# Patient Record
Sex: Female | Born: 1965 | Race: Black or African American | Hispanic: No | Marital: Single | Smoking: Current every day smoker
Health system: Southern US, Community
[De-identification: ages and names within clinical notes are randomized; demographics above are authoritative.]

## PROBLEM LIST (undated history)

## (undated) DIAGNOSIS — J45909 Unspecified asthma, uncomplicated: Secondary | ICD-10-CM

## (undated) HISTORY — PX: ABDOMINAL HYSTERECTOMY: SHX81

---

## 2004-03-17 ENCOUNTER — Emergency Department: Payer: Self-pay | Admitting: Emergency Medicine

## 2006-08-18 ENCOUNTER — Emergency Department: Payer: Self-pay | Admitting: Internal Medicine

## 2006-10-13 ENCOUNTER — Emergency Department: Payer: Self-pay | Admitting: Unknown Physician Specialty

## 2006-10-26 ENCOUNTER — Ambulatory Visit: Payer: Self-pay | Admitting: Otolaryngology

## 2006-10-26 ENCOUNTER — Other Ambulatory Visit: Payer: Self-pay

## 2006-11-17 ENCOUNTER — Ambulatory Visit: Payer: Self-pay | Admitting: Otolaryngology

## 2011-09-13 ENCOUNTER — Ambulatory Visit: Payer: Self-pay | Admitting: Family Medicine

## 2012-06-01 ENCOUNTER — Ambulatory Visit: Payer: Self-pay | Admitting: Family Medicine

## 2013-03-28 ENCOUNTER — Emergency Department: Payer: Self-pay | Admitting: Internal Medicine

## 2013-11-13 ENCOUNTER — Emergency Department: Payer: Self-pay | Admitting: Emergency Medicine

## 2013-11-13 LAB — DRUG SCREEN, URINE

## 2013-11-13 LAB — URINALYSIS, COMPLETE
Bacteria: NONE SEEN
Bilirubin,UR: NEGATIVE
Blood: NEGATIVE
Glucose,UR: NEGATIVE mg/dL (ref 0–75)
Hyaline Cast: 2
Ketone: NEGATIVE
Leukocyte Esterase: NEGATIVE
NITRITE: NEGATIVE
PH: 5 (ref 4.5–8.0)
Protein: NEGATIVE
RBC,UR: 1 /HPF (ref 0–5)
Specific Gravity: 1.015 (ref 1.003–1.030)
Squamous Epithelial: 1

## 2013-11-13 LAB — COMPREHENSIVE METABOLIC PANEL
ALBUMIN: 3.3 g/dL — AB (ref 3.4–5.0)
ALK PHOS: 73 U/L
ALT: 23 U/L
AST: 34 U/L (ref 15–37)
Anion Gap: 11 (ref 7–16)
BUN: 8 mg/dL (ref 7–18)
Bilirubin,Total: 0.2 mg/dL (ref 0.2–1.0)
CALCIUM: 9 mg/dL (ref 8.5–10.1)
CHLORIDE: 105 mmol/L (ref 98–107)
Co2: 27 mmol/L (ref 21–32)
Creatinine: 0.76 mg/dL (ref 0.60–1.30)
Glucose: 95 mg/dL (ref 65–99)
Osmolality: 283 (ref 275–301)
Potassium: 3.9 mmol/L (ref 3.5–5.1)
Sodium: 143 mmol/L (ref 136–145)
TOTAL PROTEIN: 7.5 g/dL (ref 6.4–8.2)

## 2013-11-13 LAB — CBC
HCT: 40.3 % (ref 35.0–47.0)
HGB: 12.7 g/dL (ref 12.0–16.0)
MCH: 29.8 pg (ref 26.0–34.0)
MCHC: 31.5 g/dL — ABNORMAL LOW (ref 32.0–36.0)
MCV: 95 fL (ref 80–100)
Platelet: 170 10*3/uL (ref 150–440)
RBC: 4.26 10*6/uL (ref 3.80–5.20)
RDW: 13.6 % (ref 11.5–14.5)
WBC: 4.1 10*3/uL (ref 3.6–11.0)

## 2013-11-13 LAB — ACETAMINOPHEN LEVEL

## 2013-11-13 LAB — SALICYLATE LEVEL: Salicylates, Serum: 5.4 mg/dL — ABNORMAL HIGH

## 2013-11-13 LAB — ETHANOL
ETHANOL %: 0.106 % — AB (ref 0.000–0.080)
ETHANOL LVL: 106 mg/dL

## 2014-02-20 ENCOUNTER — Emergency Department: Payer: Self-pay | Admitting: Emergency Medicine

## 2014-02-20 LAB — CBC
HCT: 39.2 % (ref 35.0–47.0)
HGB: 12.6 g/dL (ref 12.0–16.0)
MCH: 28.6 pg (ref 26.0–34.0)
MCHC: 32.1 g/dL (ref 32.0–36.0)
MCV: 89 fL (ref 80–100)
Platelet: 196 10*3/uL (ref 150–440)
RBC: 4.39 10*6/uL (ref 3.80–5.20)
RDW: 12.5 % (ref 11.5–14.5)
WBC: 4.9 10*3/uL (ref 3.6–11.0)

## 2014-02-20 LAB — COMPREHENSIVE METABOLIC PANEL
ALK PHOS: 64 U/L
ALT: 17 U/L
AST: 11 U/L — AB (ref 15–37)
Albumin: 3.3 g/dL — ABNORMAL LOW (ref 3.4–5.0)
Anion Gap: 5 — ABNORMAL LOW (ref 7–16)
BILIRUBIN TOTAL: 0.1 mg/dL — AB (ref 0.2–1.0)
BUN: 13 mg/dL (ref 7–18)
CALCIUM: 9.1 mg/dL (ref 8.5–10.1)
CO2: 31 mmol/L (ref 21–32)
CREATININE: 0.79 mg/dL (ref 0.60–1.30)
Chloride: 104 mmol/L (ref 98–107)
EGFR (African American): >60
Glucose: 91 mg/dL (ref 65–99)
Osmolality: 279 (ref 275–301)
Potassium: 4.6 mmol/L (ref 3.5–5.1)
Sodium: 140 mmol/L (ref 136–145)
TOTAL PROTEIN: 7.2 g/dL (ref 6.4–8.2)

## 2014-02-20 LAB — URINALYSIS, COMPLETE
BLOOD: NEGATIVE
Bacteria: NONE SEEN
Bilirubin,UR: NEGATIVE
Glucose,UR: NEGATIVE mg/dL (ref 0–75)
Ketone: NEGATIVE
Leukocyte Esterase: NEGATIVE
NITRITE: NEGATIVE
Ph: 5 (ref 4.5–8.0)
Protein: NEGATIVE
RBC,UR: 2 /HPF (ref 0–5)
SPECIFIC GRAVITY: 1.02 (ref 1.003–1.030)
Squamous Epithelial: 1
WBC UR: 3 /HPF (ref 0–5)

## 2014-02-20 LAB — LIPASE, BLOOD: Lipase: 242 U/L (ref 73–393)

## 2014-02-27 ENCOUNTER — Inpatient Hospital Stay: Payer: Self-pay | Admitting: Internal Medicine

## 2014-02-27 LAB — CBC WITH DIFFERENTIAL/PLATELET
BASOS ABS: 0 10*3/uL (ref 0.0–0.1)
Basophil %: 0.3 %
EOS ABS: 0 10*3/uL (ref 0.0–0.7)
Eosinophil %: 0.5 %
HCT: 38.5 % (ref 35.0–47.0)
HGB: 12.3 g/dL (ref 12.0–16.0)
LYMPHS ABS: 1.4 10*3/uL (ref 1.0–3.6)
Lymphocyte %: 14.9 %
MCH: 28.6 pg (ref 26.0–34.0)
MCHC: 31.9 g/dL — AB (ref 32.0–36.0)
MCV: 90 fL (ref 80–100)
Monocyte #: 0.4 x10 3/mm (ref 0.2–0.9)
Monocyte %: 4.2 %
Neutrophil #: 7.4 10*3/uL — ABNORMAL HIGH (ref 1.4–6.5)
Neutrophil %: 80.1 %
Platelet: 192 10*3/uL (ref 150–440)
RBC: 4.3 10*6/uL (ref 3.80–5.20)
RDW: 13.1 % (ref 11.5–14.5)
WBC: 9.2 10*3/uL (ref 3.6–11.0)

## 2014-02-27 LAB — COMPREHENSIVE METABOLIC PANEL
ALK PHOS: 116 U/L
ANION GAP: 5 — AB (ref 7–16)
AST: 47 U/L — AB (ref 15–37)
Albumin: 3.3 g/dL — ABNORMAL LOW (ref 3.4–5.0)
BILIRUBIN TOTAL: 0.3 mg/dL (ref 0.2–1.0)
BUN: 11 mg/dL (ref 7–18)
CREATININE: 0.57 mg/dL — AB (ref 0.60–1.30)
Calcium, Total: 8.6 mg/dL (ref 8.5–10.1)
Chloride: 107 mmol/L (ref 98–107)
Co2: 27 mmol/L (ref 21–32)
EGFR (African American): >60
EGFR (Non-African Amer.): >60
GLUCOSE: 118 mg/dL — AB (ref 65–99)
OSMOLALITY: 278 (ref 275–301)
POTASSIUM: 5.4 mmol/L — AB (ref 3.5–5.1)
SGPT (ALT): 162 U/L — ABNORMAL HIGH
SODIUM: 139 mmol/L (ref 136–145)
Total Protein: 7.2 g/dL (ref 6.4–8.2)

## 2014-02-27 LAB — TSH: Thyroid Stimulating Horm: 1.72 u[IU]/mL

## 2014-02-27 LAB — TROPONIN I
Troponin-I: <0.02 ng/mL
Troponin-I: <0.02 ng/mL

## 2014-02-27 LAB — LIPASE, BLOOD: Lipase: 2981 U/L — ABNORMAL HIGH (ref 73–393)

## 2014-02-28 LAB — CBC WITH DIFFERENTIAL/PLATELET
BASOS PCT: 0.4 %
Basophil #: 0 10*3/uL (ref 0.0–0.1)
EOS ABS: 0 10*3/uL (ref 0.0–0.7)
Eosinophil %: 0 %
HCT: 36.6 % (ref 35.0–47.0)
HGB: 11.7 g/dL — AB (ref 12.0–16.0)
LYMPHS ABS: 0.6 10*3/uL — AB (ref 1.0–3.6)
LYMPHS PCT: 6.6 %
MCH: 28.7 pg (ref 26.0–34.0)
MCHC: 32 g/dL (ref 32.0–36.0)
MCV: 90 fL (ref 80–100)
Monocyte #: 0.3 x10 3/mm (ref 0.2–0.9)
Monocyte %: 3.8 %
NEUTROS ABS: 8 10*3/uL — AB (ref 1.4–6.5)
Neutrophil %: 89.2 %
Platelet: 188 10*3/uL (ref 150–440)
RBC: 4.09 10*6/uL (ref 3.80–5.20)
RDW: 13.2 % (ref 11.5–14.5)
WBC: 9 10*3/uL (ref 3.6–11.0)

## 2014-02-28 LAB — COMPREHENSIVE METABOLIC PANEL WITH GFR
Albumin: 3.2 g/dL — ABNORMAL LOW (ref 3.4–5.0)
Alkaline Phosphatase: 108 U/L
Anion Gap: 9 (ref 7–16)
BUN: 10 mg/dL (ref 7–18)
Bilirubin,Total: 0.3 mg/dL (ref 0.2–1.0)
Calcium, Total: 8.7 mg/dL (ref 8.5–10.1)
Chloride: 107 mmol/L (ref 98–107)
Co2: 25 mmol/L (ref 21–32)
Creatinine: 0.81 mg/dL (ref 0.60–1.30)
EGFR (African American): >60
EGFR (Non-African Amer.): >60
Glucose: 123 mg/dL — ABNORMAL HIGH (ref 65–99)
Osmolality: 282 (ref 275–301)
Potassium: 4.3 mmol/L (ref 3.5–5.1)
SGOT(AST): 27 U/L (ref 15–37)
SGPT (ALT): 134 U/L — ABNORMAL HIGH
Sodium: 141 mmol/L (ref 136–145)
Total Protein: 6.9 g/dL (ref 6.4–8.2)

## 2014-02-28 LAB — LIPASE, BLOOD: LIPASE: 5243 U/L — AB (ref 73–393)

## 2014-03-01 LAB — COMPREHENSIVE METABOLIC PANEL
ANION GAP: 7 (ref 7–16)
AST: 28 U/L (ref 15–37)
Albumin: 2.6 g/dL — ABNORMAL LOW (ref 3.4–5.0)
Alkaline Phosphatase: 93 U/L
BUN: 9 mg/dL (ref 7–18)
Bilirubin,Total: 0.4 mg/dL (ref 0.2–1.0)
CHLORIDE: 110 mmol/L — AB (ref 98–107)
CREATININE: 0.54 mg/dL — AB (ref 0.60–1.30)
Calcium, Total: 7.8 mg/dL — ABNORMAL LOW (ref 8.5–10.1)
Co2: 23 mmol/L (ref 21–32)
GLUCOSE: 88 mg/dL (ref 65–99)
Osmolality: 278 (ref 275–301)
POTASSIUM: 3.5 mmol/L (ref 3.5–5.1)
SGPT (ALT): 86 U/L — ABNORMAL HIGH
Sodium: 140 mmol/L (ref 136–145)
Total Protein: 5.8 g/dL — ABNORMAL LOW (ref 6.4–8.2)

## 2014-03-01 LAB — LIPASE, BLOOD: Lipase: 1347 U/L — ABNORMAL HIGH (ref 73–393)

## 2014-03-02 LAB — CBC WITH DIFFERENTIAL/PLATELET
BASOS ABS: 0 10*3/uL (ref 0.0–0.1)
Basophil %: 0.2 %
EOS ABS: 0.1 10*3/uL (ref 0.0–0.7)
Eosinophil %: 1.2 %
HCT: 31.9 % — ABNORMAL LOW (ref 35.0–47.0)
HGB: 10.4 g/dL — ABNORMAL LOW (ref 12.0–16.0)
LYMPHS ABS: 1.6 10*3/uL (ref 1.0–3.6)
Lymphocyte %: 14.2 %
MCH: 28.7 pg (ref 26.0–34.0)
MCHC: 32.7 g/dL (ref 32.0–36.0)
MCV: 88 fL (ref 80–100)
MONO ABS: 0.8 x10 3/mm (ref 0.2–0.9)
MONOS PCT: 6.8 %
NEUTROS ABS: 9 10*3/uL — AB (ref 1.4–6.5)
Neutrophil %: 77.6 %
PLATELETS: 145 10*3/uL — AB (ref 150–440)
RBC: 3.64 10*6/uL — AB (ref 3.80–5.20)
RDW: 12.6 % (ref 11.5–14.5)
WBC: 11.6 10*3/uL — AB (ref 3.6–11.0)

## 2014-03-02 LAB — COMPREHENSIVE METABOLIC PANEL
ALT: 65 U/L — AB
Albumin: 2.4 g/dL — ABNORMAL LOW (ref 3.4–5.0)
Alkaline Phosphatase: 91 U/L
Anion Gap: 9 (ref 7–16)
BUN: 8 mg/dL (ref 7–18)
Bilirubin,Total: 0.5 mg/dL (ref 0.2–1.0)
CO2: 24 mmol/L (ref 21–32)
CREATININE: 0.58 mg/dL — AB (ref 0.60–1.30)
Calcium, Total: 7.8 mg/dL — ABNORMAL LOW (ref 8.5–10.1)
Chloride: 108 mmol/L — ABNORMAL HIGH (ref 98–107)
EGFR (African American): >60
EGFR (Non-African Amer.): >60
Glucose: 58 mg/dL — ABNORMAL LOW (ref 65–99)
Osmolality: 277 (ref 275–301)
Potassium: 3.5 mmol/L (ref 3.5–5.1)
SGOT(AST): 19 U/L (ref 15–37)
Sodium: 141 mmol/L (ref 136–145)
Total Protein: 6 g/dL — ABNORMAL LOW (ref 6.4–8.2)

## 2014-03-02 LAB — LIPASE, BLOOD: Lipase: 306 U/L (ref 73–393)

## 2014-03-03 LAB — COMPREHENSIVE METABOLIC PANEL
ALBUMIN: 2.4 g/dL — AB (ref 3.4–5.0)
AST: 20 U/L (ref 15–37)
Alkaline Phosphatase: 82 U/L
Anion Gap: 5 — ABNORMAL LOW (ref 7–16)
BILIRUBIN TOTAL: 0.4 mg/dL (ref 0.2–1.0)
BUN: 3 mg/dL — ABNORMAL LOW (ref 7–18)
CALCIUM: 8 mg/dL — AB (ref 8.5–10.1)
CHLORIDE: 107 mmol/L (ref 98–107)
Co2: 29 mmol/L (ref 21–32)
Creatinine: 0.56 mg/dL — ABNORMAL LOW (ref 0.60–1.30)
EGFR (African American): >60
Glucose: 89 mg/dL (ref 65–99)
Osmolality: 277 (ref 275–301)
POTASSIUM: 3.2 mmol/L — AB (ref 3.5–5.1)
SGPT (ALT): 52 U/L
Sodium: 141 mmol/L (ref 136–145)
Total Protein: 6.1 g/dL — ABNORMAL LOW (ref 6.4–8.2)

## 2014-03-03 LAB — LIPASE, BLOOD: Lipase: 246 U/L (ref 73–393)

## 2014-07-13 NOTE — Consult Note (Signed)
Overall pain improved. Lipase coming down. Not hungry yet. Continue NPO rest of today. Perhaps, can start clears tomorrow if clinically improved. Make sure patient stays on low fat diet x 1 week even after discharge. Hopefully, discharge over the weekend. Pt to have EUS arranged later. Will check on Monday if patient still here. Thanks.  Electronic Signatures: Lutricia Feilh, Vanesa Renier (MD)  (Signed on 11-Dec-15 11:23)  Authored  Last Updated: 11-Dec-15 11:23 by Lutricia Feilh, Bunyan Brier (MD)

## 2014-07-13 NOTE — Consult Note (Signed)
See my ERCP note. Abnormal U/S and CT. Post ERCP with signif abdominal pain. So far, given fentanyl 150ug with temporary relief. Definite tenderness in abdomen. Pt likely developing acute pancreatitis with inadequate drainage of pancreatic juice from lack of pancreatic stenting. Asked hospitalist to admit patient. Keep patient NPO. Aggressive IV hydration. Pain and nausea meds. Labs, incl lipase tomorrow. Thanks.  Electronic Signatures: Lutricia Feilh, Fantasia Jinkins (MD)  (Signed on 09-Dec-15 16:39)  Authored  Last Updated: 09-Dec-15 16:39 by Lutricia Feilh, Janele Lague (MD)

## 2014-07-13 NOTE — Consult Note (Signed)
Chief Complaint:  Subjective/Chief Complaint Less abdominal pain than yesterday afternoon.   VITAL SIGNS/ANCILLARY NOTES: **Vital Signs.:   10-Dec-15 15:41  Vital Signs Type Q 8hr  Temperature Temperature (F) 99.1  Celsius 37.2  Temperature Source oral  Pulse Pulse 82  Respirations Respirations 17  Systolic BP Systolic BP 517  Diastolic BP (mmHg) Diastolic BP (mmHg) 91  Mean BP 111  Pulse Ox % Pulse Ox % 94  Pulse Ox Activity Level  At rest  Oxygen Delivery Room Air/ 21 %   Brief Assessment:  GEN no acute distress   Cardiac Regular   Respiratory clear BS   Gastrointestinal details normal Soft  diffuse tenderness with decreased bowel sounds   Lab Results: Hepatic:  10-Dec-15 04:03   Bilirubin, Total 0.3  Alkaline Phosphatase 108 (46-116 NOTE: New Reference Range 10/09/13)  SGPT (ALT)  134 (14-63 NOTE: New Reference Range 10/09/13)  SGOT (AST) 27  Total Protein, Serum 6.9  Albumin, Serum  3.2  Routine Chem:  10-Dec-15 04:03   Lipase  5243 (Result(s) reported on 28 Feb 2014 at 05:15AM.)  Glucose, Serum  123  BUN 10  Creatinine (comp) 0.81  Sodium, Serum 141  Potassium, Serum 4.3  Chloride, Serum 107  CO2, Serum 25  Calcium (Total), Serum 8.7  Osmolality (calc) 282  eGFR (African American) >60  eGFR (Non-African American) >60 (eGFR values <49m/min/1.73 m2 may be an indication of chronic kidney disease (CKD). Calculated eGFR, using the MRDR Study equation, is useful in  patients with stable renal function. The eGFR calculation will not be reliable in acutely ill patients when serum creatinine is changing rapidly. It is not useful in patients on dialysis. The eGFR calculation may not be applicable to patients at the low and high extremes of body sizes, pregnant women, and vegetarians.)  Anion Gap 9  Routine Hem:  10-Dec-15 04:03   WBC (CBC) 9.0  RBC (CBC) 4.09  Hemoglobin (CBC)  11.7  Hematocrit (CBC) 36.6  Platelet Count (CBC) 188  MCV 90  MCH  28.7  MCHC 32.0  RDW 13.2  Neutrophil % 89.2  Lymphocyte % 6.6  Monocyte % 3.8  Eosinophil % 0.0  Basophil % 0.4  Neutrophil #  8.0  Lymphocyte #  0.6  Monocyte # 0.3  Eosinophil # 0.0  Basophil # 0.0 (Result(s) reported on 28 Feb 2014 at 05:11AM.)   Assessment/Plan:  Assessment/Plan:  Assessment Post ERCP pancreatitis.   Plan Continue NPO, pain meds, nausea meds, IV hydration. Our office is in the process of arranging EUS with [facility]. Will see patient in AM. thanks.   Electronic Signatures: OVerdie Shire(MD)  (Signed 10-Dec-15 16:17)  Authored: Chief Complaint, VITAL SIGNS/ANCILLARY NOTES, Brief Assessment, Lab Results, Assessment/Plan   Last Updated: 10-Dec-15 16:17 by OVerdie Shire(MD)

## 2014-07-17 NOTE — H&P (Signed)
PATIENT NAME:  Jackie Rowe, Jackie Rowe MR#:  [id number] DATE OF BIRTH:  03-01-1966  DATE OF ADMISSION:  02/27/2014  PRIMARY CARE PHYSICIAN: None.   HISTORY OF PRESENT ILLNESS: The patient is 49 year old African American female with a history of abdominal pains and recent admission to the Emergency Room, which revealed abnormal CT scan, concerning for possible biliary tract abnormalities. She was brought in by Dr. Bluford Kaufmann to the endoscopy suite for an ERCP today, on 02/27/2014, because of ongoing abdominal pains. She attempted to get an ERCP done; however, it was unsuccessful, and because of her significant abdominal pains requiring 150 mcg of fentanyl injection, hospitalist services were contacted for admission. During my evaluation, the patient is very somnolent, poorly awakened and I am having difficulty getting any review of systems or in general getting any information.    Apparently, the patient presented to the Emergency Room on 02/20/2014. According to the Emergency Room note, the patient was having abdominal pain for the past 10 days prior to coming to the Emergency Room. She described the pain as suprapubic. She had a CT scan of her abdomen done on 02/20/2014, which revealed significant abnormalities concerning for biliary tract abnormalities, questionable mucosal enhancement, as well as focal areas of narrowing in the proximal common bile duct as well as progressive enhancing soft tissues in hepatic hilum for possible bile duct neoplasm such as cholangiocarcinoma or other biliary tract abnormalities such as extrahepatic cholangitis. ERCP was recommended. The patient was scheduled for followup with Dr. Lutricia Feil, and ERCP was performed today on 02/27/2014. Unfortunately, the procedure was unsuccessful, as mentioned above.   The patient herself now is complaining of significant abdominal pains in the upper abdomen. Apparently, the patient has been having those abdominal pains for a while. It is difficult to know  exactly how long she has been having those abdominal pains, but 20 years ago she had hysterectomy which was performed due to severe abdominal pains. She does not remember exactly what was the reason, why hysterectomy was in fact performed, but when I mention to her  endometriosis, she does not  remember this name being mentioned to her.  Past medical history is significant for what looks like and sounds like chronic abdominal pains.   MEDICATIONS: Norco 5/325 mg 1 tablet every 6 hours as needed, promethazine 12.5 mg 1-2 tablets every 6 hours as needed.   PAST SURGICAL HISTORY: Gallbladder surgery, hysterectomy 20 years ago, ERCP today on 02/27/2014 by Dr. Bluford Kaufmann.   ALLERGIES: None.   FAMILY HISTORY: The patient's sister had hypertension. The patient's father had lung cancer.   SOCIAL HISTORY: The patient is single and has no children. She has been smoking approximately 10 cigarettes a day since the age of 81. Does not drink any alcohol now, but in the past; stopped drinking in August 2015. She is not giving me any reason why she actually stopped drinking.   REVIEW OF SYSTEMS:  CONSTITUTIONAL: Positive for abdominal pains, intermittent lung wheezing, and nausea now, after numerous administrations of fentanyl. She denies any fevers, chills, fatigue, weakness, weight loss or gain.  EYES: Denies any blurry vision, double vision, glaucoma, or cataracts.  EARS, NOSE, AND THROAT: Denies any tinnitus, allergies, epistaxis, sinus pain, dentures, or difficulty swallowing.  RESPIRATORY: Denies cough, wheezes, asthma, or COPD.  CARDIOVASCULAR: Denies any chest pain, orthopnea, or arrhythmias.  GASTROINTESTINAL: Denies any diarrhea, rectal bleeding, change in bowel habits.  GENITOURINARY: Denies dysuria, hematuria, frequency, incontinence.  ENDOCRINOLOGY: Denies any polydipsia, nocturia, thyroid problems, heat  or cold intolerance or thirst.  HEMATOLOGIC: Denies anemia, easy bruising or bleeding, or swollen  glands. SKIN: Denies any acne, rashes, lesions or change in moles.  MUSCULOSKELETAL: Denies arthritis, cramps, swelling.  NEUROLOGIC: Denies numbness, epilepsy or tremor.  PSYCHIATRIC: Denies anxiety, insomnia, or depression.   PHYSICAL EXAMINATION:  VITAL SIGNS: During my evaluation, temperature was unknown, pulse was 47, respirations were 16, blood pressure 140 to 150s over 90s, oxygen saturation was 97%.  GENERAL: This is a well-developed, well-nourished African American female lying on the stretcher, very poorly awakened, very somnolent and groggy.  HEENT: Her pupils are equal and reactive to light. Extraocular muscles are intact.  No icterus. No jaundice. Has normal hearing. No pharyngeal erythema. Mucosa is moist.  NECK: No masses, Supple, nontender. Thyroid is not enlarged. No adenopathy. No JVD or carotid bruits bilaterally.  LUNGS: Clear to auscultation in all fields. Somewhat diminished breath sounds, but no rales, rhonchi, or wheezing. No labored inspirations, increased effort, dullness to percussion or overt respiratory distress.  CARDIOVASCULAR: S1 and S2 appreciated.  Regular rate and rhythm. PMI not lateralized. Chest is nontender to palpation. There are 1+ pedal pulses. No lower extremity edema, calf tenderness, or cyanosis.  ABDOMEN: Protuberant, soft. No hepatosplenomegaly or masses were noted. Bowel sounds were present, but diminished. The patient does have significant tenderness all over her abdomen, mostly in the upper abdomen; however, no rebound or guarding was noted.  MUSCULOSKELETAL: Muscle Strength: Able to move all extremities. No cyanosis, degenerative joint disease or kyphosis. Gait not tested.  SKIN: Did not reveal any rashes, lesions, erythema, nodularity, or induration. It was warm and dry to palpation.  LYMPHATIC: No adenopathy in the cervical region.  NEUROLOGIC: Cranial nerves grossly intact. Sensory is intact. No dysarthria or aphasia. The patient is somnolent,  poorly awakens. Cranial nerves grossly intact. Sensory is intact. The patient does have some dysarthria. She is somnolent, poorly cooperative. Memory is impaired.   LABORATORY DATA: Labs are pending. The patient's labs from 02/25/2014, revealed a normal BMP, normal lipase level of 242, albumin level of 3.3, total bilirubin was 0.1; otherwise, unremarkable liver enzymes. CBC: Within normal limits with white blood cell count 4.9, hemoglobin 12.6, platelets 196,000. Urinalysis at that point was normal, on 02/25/2014, with 3 red blood cells as well as 2 white blood cells. EKG at that time revealed sinus brady at 49 beats per minute, nonspecific T-wave abnormality, but no significant change since prior EKG in August. The patient's current labs are pending. No EKG was performed as of yet.   ASSESSMENT AND PLAN:  1.  Diffuse abdominal pain of unclear etiology. Very concerning for possible chronic abdominal pains as the patient is describing abdominal pains for the past 7 years, including requiring hysterectomy for abdominal pains. Admit the patient to the medical floor, supportive therapy. Will not give her any opiates at this time. The patient is bradycardic to 48 during my evaluation. We will get laboratories including comprehensive metabolic panel, lipase level, and we will start the patient on PPIs. We will get gastroenterology involved for further recommendations.  2.  Bradycardia, likely opiate related. Get EKG.  3.  Nausea. Again, possibly opiate related; however, could be procedure. To initiate the patient on PPI, Zofran, as well as Phenergan and IV fluids.  4.  Abnormal radiologic studies concerning for biliary mass. We will get comprehensive metabolic panel, lipase level, and gastroenterology consultation.   TIME SPENT: 50 minutes.    ____________________________ Katharina Caperima Madelyn Tlatelpa, MD rv:MT D: 02/27/2014 17:45:57  ET T: 02/27/2014 19:03:04 ET JOB#: [id number]  cc: Katharina Caper, MD, <Dictator> Seiya Silsby MD ELECTRONICALLY SIGNED 04/02/2014 18:39

## 2014-07-17 NOTE — Discharge Summary (Signed)
PATIENT NAME:  Jackie Rowe, Jackie Rowe MR#:  [id number] DATE OF BIRTH:  1965/09/02  DATE OF ADMISSION:  02/27/2014 DATE OF DISCHARGE:  03/03/2014  ADMITTING PHYSICIAN: Katharina Caperima Vaickute, MD  DISCHARGING PHYSICIAN: Enid Baasadhika Keyontae Huckeby, MD   PRIMARY CARE PHYSICIAN: None.   CONSULTATIONS IN THE HOSPITAL: GI consultation by Dr. Bluford Kaufmannh.   DISCHARGE DIAGNOSES: 1. Acute pancreatitis post endoscopic retrograde cholangiopancreatography.  2. Abnormalities of biliary tract noted on CT scan, outpatient endoscopic ultrasonography.  3. Hyperkalemia.  4. Tobacco use disorder.   DISCHARGE HOME MEDICATIONS:  1. Norco 5/325 mg 1 tablet every 6 hours p.r.n. for pain.  2. Promethazine 25 mg every 8 hours p.r.n. for nausea and vomiting.   DISCHARGE DIET: Low-sodium diet.   DISCHARGE ACTIVITY: As tolerated.    FOLLOWUP INSTRUCTIONS:  1. PCP follow-up in 1 week.  2. GI follow-up for EUS in 2 weeks.   LABORATORY STUDIES: Prior to discharge: Sodium 141, potassium 3.2, chloride 107, bicarbonate 29, BUN 3, creatinine 0.56, glucose 89 and calcium of 8.0. ALT 52, AST 20, alkaline phosphatase 82, total bilirubin 0.4, albumin of 2.4, lipase is 246. WBC 11.6, hemoglobin 10.4, hematocrit 31.9, platelet count 145,000. Lipase on admission was 2981.   BRIEF HOSPITAL COURSE: Ms Alessandra BevelsVaughn is a 49 year old, African-American female with no significant past medical history, presented to the emergency room for abdominal pain. Had CT and ultrasound done on 02/20/2014, which revealed significant abnormalities of the biliary tract, mucosal enhancement and narrowing of the common bile. They commented even possible neoplasm cannot be excluded as well. ERCP was recommended. The patient was taken for ERCP by Dr. Bluford Kaufmannh as an outpatient on 02/27/2014. Unfortunately, during the procedure, the patient developed significant abdominal pain and the procedure could not be completed. He was noted to have acute pancreatitis.  1. Acute pancreatitis post ERCP. The ERCP  was not completed. Seen by GI. The patient was kept n.p.o. Fluids were given. Waited until the pancreas got rest and lipase normalized. Now she is able to eat a regular diet. Lipase is normal. Does not have any further abdominal pain, nausea or vomiting complaints. She still has biliary tract abnormalities noted on the CT scan, for which Dr. Bluford Kaufmannh recommended outpatient follow-up after complete healing of her pancreatitis for an EUS to be done as an outpatient. The patient was given some pain medication and nausea medication as needed.  2. Transient bradycardia in the hospital, likely opiate related, resolved.  3. Transient hypertension, likely from pain and anxiety. The patient denied any medications at this time and said she would follow up as an outpatient.  4. Hypokalemia, replaced appropriately.  5. Her course has been otherwise uneventful in the hospital.   DISCHARGE CONDITION: Stable.   DISCHARGE DISPOSITION: Home.  TIME SPENT ON DISCHARGE: 40 minutes.     ____________________________ Enid Baasadhika Geroldine Esquivias, MD rk:TT D: 03/03/2014 12:34:41 ET T: 03/03/2014 17:28:50 ET JOB#: [id number]  cc: Enid Baasadhika Nataliya Graig, MD, <Dictator> Enid BaasADHIKA Ayrabella Labombard MD ELECTRONICALLY SIGNED 03/26/2014 14:36

## 2015-01-29 ENCOUNTER — Other Ambulatory Visit: Payer: Self-pay | Admitting: Family Medicine

## 2015-01-29 ENCOUNTER — Ambulatory Visit
Admission: RE | Admit: 2015-01-29 | Discharge: 2015-01-29 | Disposition: A | Discharge status: Home / Self Care | Payer: Self-pay | Source: Ambulatory Visit | Attending: Family Medicine | Admitting: Family Medicine

## 2015-01-29 DIAGNOSIS — R7611 Nonspecific reaction to tuberculin skin test without active tuberculosis: Secondary | ICD-10-CM

## 2015-11-24 IMAGING — CT CT ABDOMEN WO/W CM
2 of 10 series · 11 of 46 positions shown, 16 images · IV contrast (isovue)
Comparison: Abdominal ultrasound 02/20/2014.

CLINICAL DATA: 48-year-old female with history of 10 days of right
upper quadrant abdominal pain. Possible pancreatic mass noted on
recent abdominal ultrasound. Follow-up examination to evaluate for
potential malignancy.

EXAM:
CT ABDOMEN WITHOUT AND WITH CONTRAST
TECHNIQUE: Multidetector CT imaging of the abdomen was performed following the
standard protocol before and following the bolus administration of
intravenous contrast.
CONTRAST:  100 mL of Isovue 370.

[Series 8: pancreas venous thins · axial · portal-venous · 0.56mm/px · z∈[-707,-532]mm · 8 of 151 slices shown, 13 images]
[im 17/151  soft-tissue]
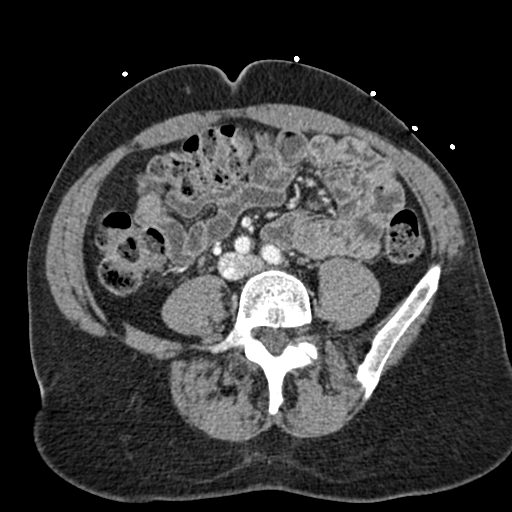
[im 17/151  bone]
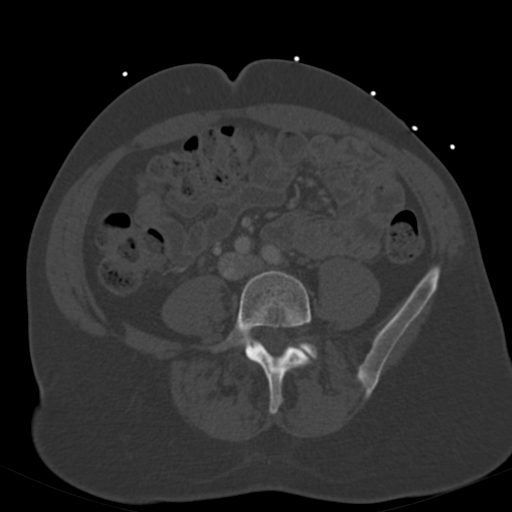
[im 34/151  soft-tissue]
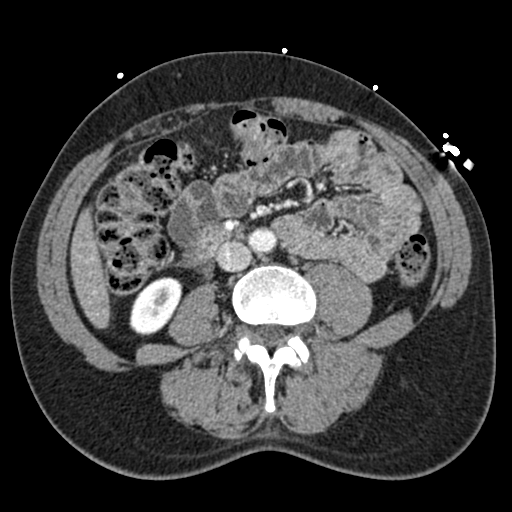
[im 51/151  soft-tissue]
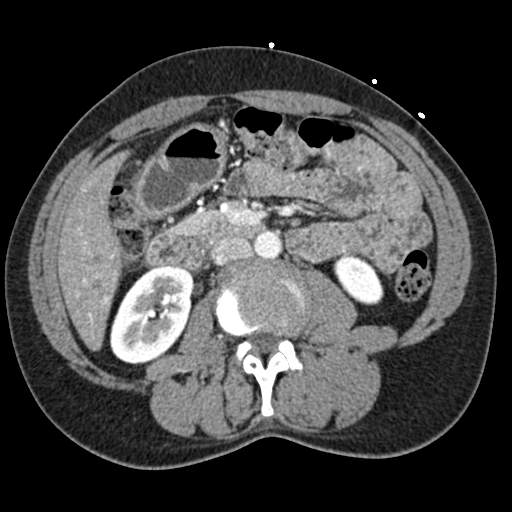
[im 67/151  soft-tissue]
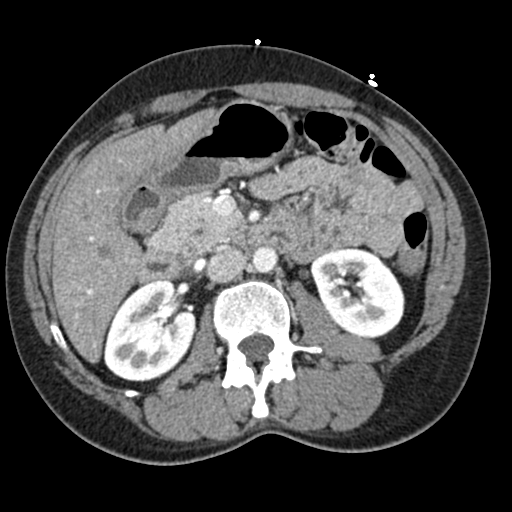
[im 84/151  soft-tissue]
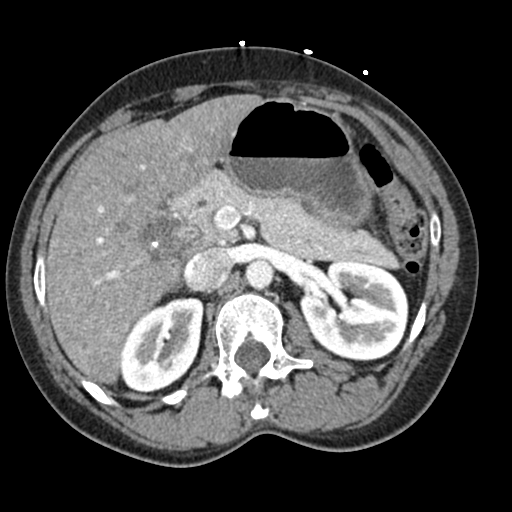
[im 84/151  lung]
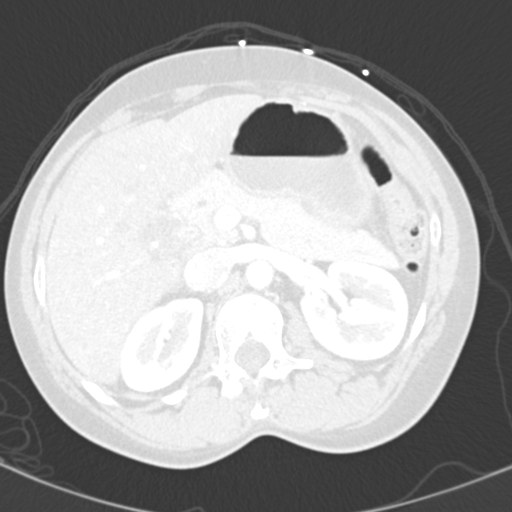
[im 101/151  soft-tissue]
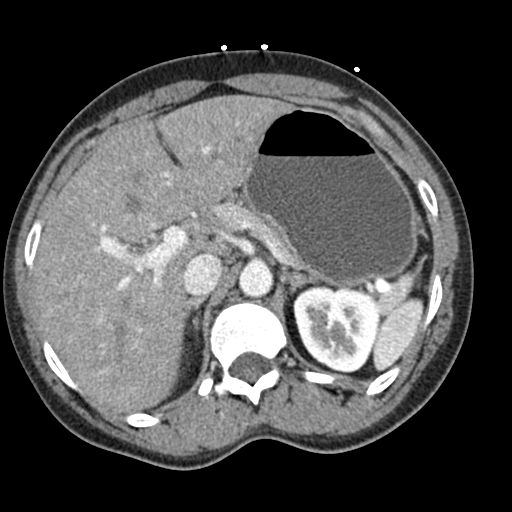
[im 101/151  lung]
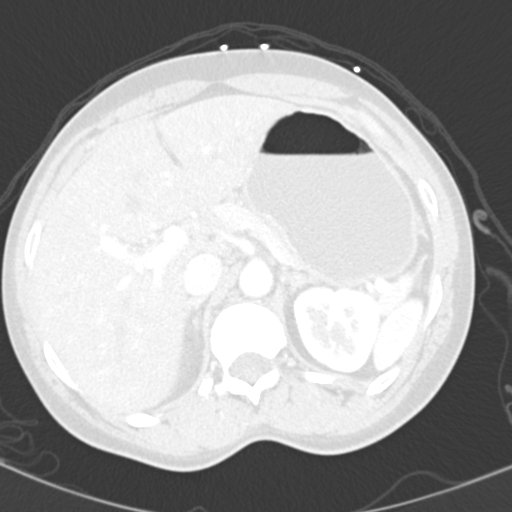
[im 117/151  soft-tissue]
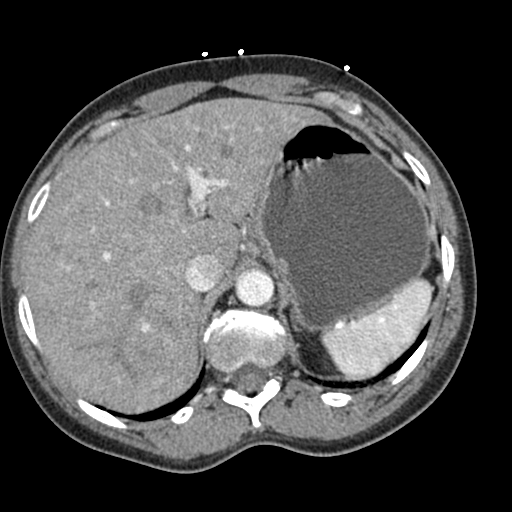
[im 117/151  lung]
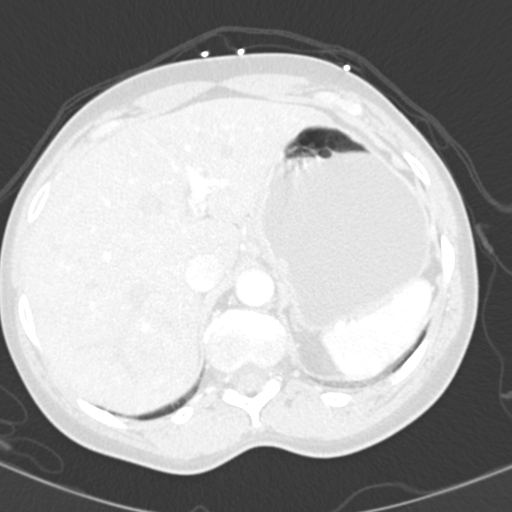
[im 134/151  soft-tissue]
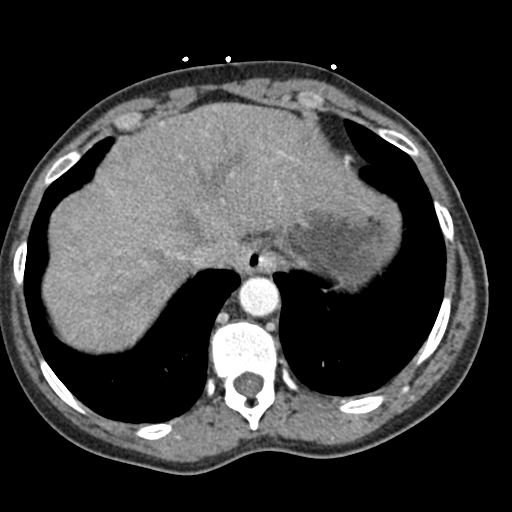
[im 134/151  lung]
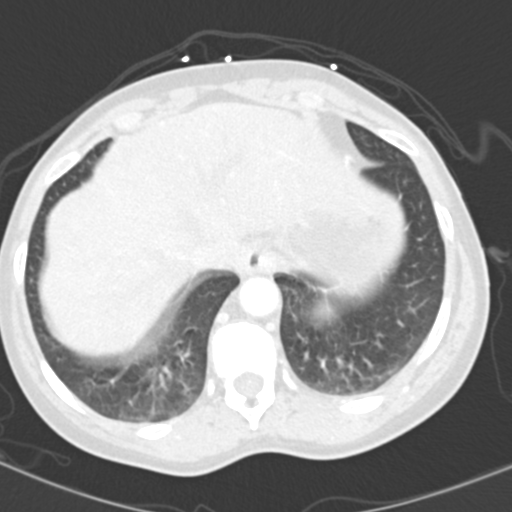

[Series 9: cor pancreas arterial · coronal · arterial · 0.45mm/px · 3 of 119 slices shown]
[im 30/119  soft-tissue]
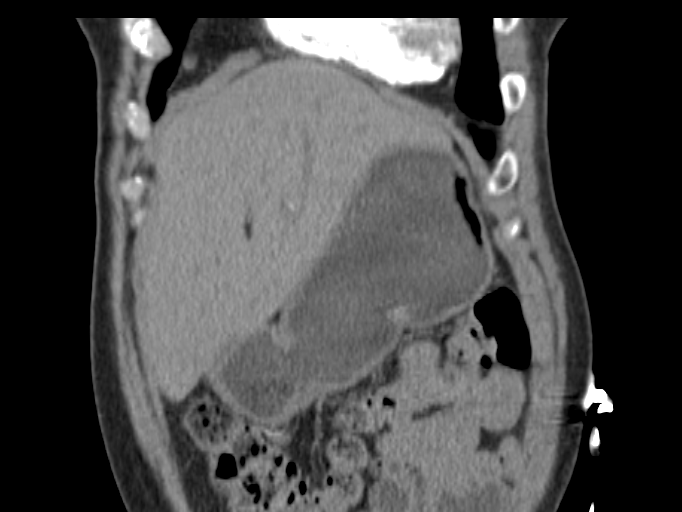
[im 60/119  soft-tissue]
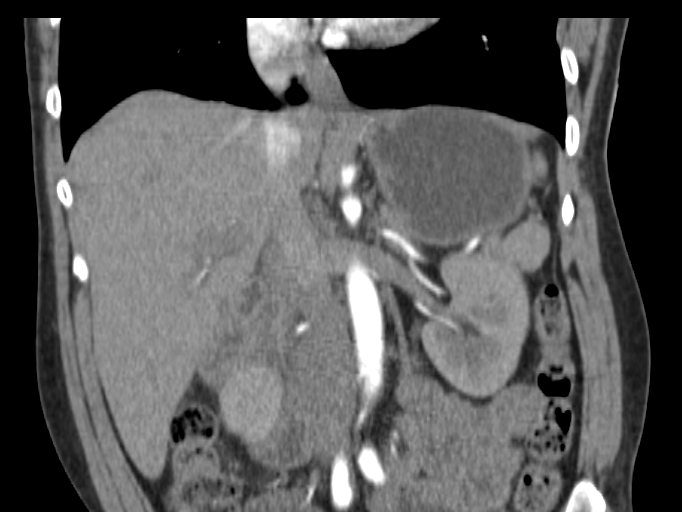
[im 89/119  soft-tissue]
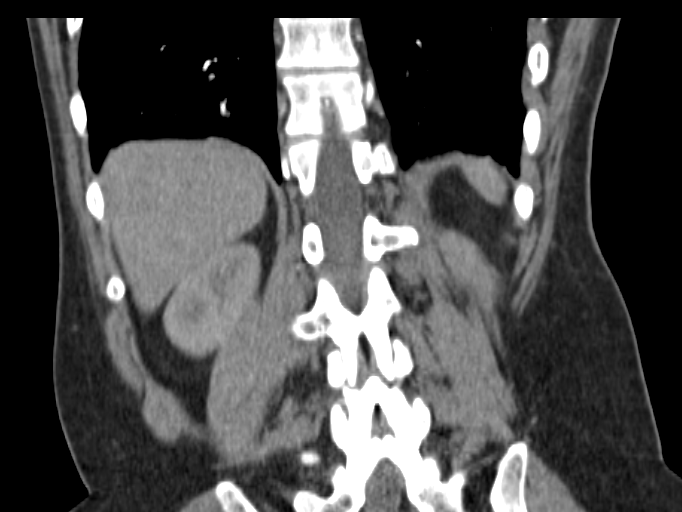

[11 of 46 positions shown; findings below may reference images not displayed]

FINDINGS: Lower chest: 6 mm ground-glass attenuation nodule in the left lower
lobe (image 11 of series 4). Extensive bronchial wall thickening
throughout the visualized lung bases bilaterally.

Hepatobiliary: Status post cholecystectomy. No suspicious cystic or
solid hepatic lesions. No intrahepatic biliary ductal dilatation.
However, the common hepatic duct, cystic duct remnant and common
bile duct all demonstrate very profound mucosal enhancement.
Additionally, there is some amorphous soft tissue adjacent to the
common hepatic duct and cystic duct remnant, best appreciated on
image 46 of series 7 in the hepatic hilum, which appears to
progressively enhance. Focal narrowing of the proximal common bile
duct is also noted on images 51-54 of series 7.

Pancreas: The pancreas is grossly normal in appearance.
Specifically, no pancreatic masses identified. No pancreatic ductal
dilatation (pancreatic duct measures up to 3 mm). No peripancreatic
inflammatory changes or lymphadenopathy.

Spleen: Unremarkable.

Adrenals/Urinary Tract: Bilateral adrenal glands and bilateral
kidneys are normal in appearance. No hydroureteronephrosis.

Stomach/Bowel: The stomach is normal in appearance. No pathologic
dilatation of small bowel or colon in the visualized abdomen.

Vascular/Lymphatic: Mild atherosclerosis in the abdominal and upper
pelvic vasculature, without evidence of aneurysm or dissection. No
lymphadenopathy noted in the abdomen.

Other: Within the visualized peritoneal cavity there is no
significant volume of ascites and no pneumoperitoneum.

Musculoskeletal: There are no aggressive appearing lytic or blastic
lesions noted in the visualized portions of the skeleton.
IMPRESSION: 1. No pancreatic head mass or peripancreatic lesion identified to
account for the perceived abnormality on the recent ultrasound
examination.
2. However, there is a very unusual appearance of the common hepatic
duct, cystic duct remnant and common bile duct, which demonstrate
avid mucosal enhancement, focal areas of narrowing in the proximal
common bile duct, and some associated surrounding progressively
enhancing soft tissue in the hepatic hilum. These findings are
concerning for potential bile duct neoplasm such as
cholangiocarcinoma, or potentially biliary tract infection (i.e.,
extrahepatic cholangitis). However, there is no associated
intrahepatic biliary ductal dilatation at this time. Clinical
correlation is recommended, with further evaluation with ERCP and/or
MRCP suggested in the near future.
3. 6 mm ground-glass attenuation nodule in the left lower lobe.
Initial follow-up by chest CT without contrast is recommended in 3
months to confirm persistence. This recommendation follows the
consensus statement: Recommendations for the Management of Subsolid
Pulmonary Nodules Detected at CT: A Statement from the Closet

## 2017-11-14 ENCOUNTER — Other Ambulatory Visit: Payer: Self-pay | Admitting: Family Medicine

## 2017-11-14 DIAGNOSIS — Z1231 Encounter for screening mammogram for malignant neoplasm of breast: Secondary | ICD-10-CM

## 2017-12-13 ENCOUNTER — Ambulatory Visit
Admission: RE | Admit: 2017-12-13 | Discharge: 2017-12-13 | Disposition: A | Discharge status: Home / Self Care | Payer: PRIVATE HEALTH INSURANCE | Source: Ambulatory Visit | Attending: Family Medicine | Admitting: Family Medicine

## 2017-12-13 DIAGNOSIS — Z1231 Encounter for screening mammogram for malignant neoplasm of breast: Secondary | ICD-10-CM | POA: Diagnosis present

## 2018-11-30 ENCOUNTER — Other Ambulatory Visit: Payer: Self-pay | Admitting: Allergy

## 2018-12-12 ENCOUNTER — Other Ambulatory Visit: Payer: Self-pay | Admitting: Family Medicine

## 2018-12-12 DIAGNOSIS — Z1231 Encounter for screening mammogram for malignant neoplasm of breast: Secondary | ICD-10-CM

## 2019-01-24 ENCOUNTER — Ambulatory Visit
Admission: RE | Admit: 2019-01-24 | Discharge: 2019-01-24 | Disposition: A | Discharge status: Home / Self Care | Payer: BLUE CROSS/BLUE SHIELD | Source: Ambulatory Visit | Attending: Family Medicine | Admitting: Family Medicine

## 2019-01-24 DIAGNOSIS — Z1231 Encounter for screening mammogram for malignant neoplasm of breast: Secondary | ICD-10-CM | POA: Insufficient documentation

## 2019-06-22 ENCOUNTER — Ambulatory Visit: Payer: PRIVATE HEALTH INSURANCE | Attending: Internal Medicine

## 2019-06-22 DIAGNOSIS — Z23 Encounter for immunization: Secondary | ICD-10-CM

## 2019-06-22 NOTE — Progress Notes (Signed)
   Covid-19 Vaccination Clinic  Name:  JAKAILA NORMENT    MRN: [id number] DOB: 12-03-1965  06/22/2019  Ms. Kuehne was observed post Covid-19 immunization for 15 minutes without incident. She was provided with Vaccine Information Sheet and instruction to access the V-Safe system.   Ms. Concepcion was instructed to call 911 with any severe reactions post vaccine: Marland Kitchen Difficulty breathing  . Swelling of face and throat  . A fast heartbeat  . A bad rash all over body  . Dizziness and weakness   Immunizations Administered    Name Date Dose VIS Date Route   Pfizer COVID-19 Vaccine 06/22/2019  8:33 AM 0.3 mL 03/02/2019 Intramuscular   Manufacturer: [employer], [employer]   Lot: [id number]   NDC: [address]-1

## 2019-06-25 ENCOUNTER — Ambulatory Visit: Payer: PRIVATE HEALTH INSURANCE

## 2019-07-17 ENCOUNTER — Ambulatory Visit: Payer: PRIVATE HEALTH INSURANCE | Attending: Internal Medicine

## 2019-07-17 DIAGNOSIS — Z23 Encounter for immunization: Secondary | ICD-10-CM

## 2019-07-17 NOTE — Progress Notes (Signed)
   Covid-19 Vaccination Clinic  Name:  Jackie Rowe    MRN: [id number] DOB: [birth year]  07/17/2019  [name] was observed post Covid-19 immunization for 15 minutes without incident. She was provided with Vaccine Information Sheet and instruction to access the V-Safe system.   [name] was instructed to call 911 with any severe reactions post vaccine: Marland Kitchen Difficulty breathing  . Swelling of face and throat  . A fast heartbeat  . A bad rash all over body  . Dizziness and weakness   Immunizations Administered    Name Date Dose VIS Date Route   Pfizer COVID-19 Vaccine 07/17/2019  4:05 PM 0.3 mL 05/16/2018 Intramuscular   Manufacturer: [employer], [employer]   Lot: [id number]   NDC: [address]-1

## 2019-07-28 ENCOUNTER — Encounter: Payer: Self-pay | Admitting: Emergency Medicine

## 2019-07-28 ENCOUNTER — Emergency Department
Admission: EM | Admit: 2019-07-28 | Discharge: 2019-07-28 | Disposition: A | Discharge status: Home / Self Care | Payer: BLUE CROSS/BLUE SHIELD | Attending: Emergency Medicine | Admitting: Emergency Medicine

## 2019-07-28 ENCOUNTER — Other Ambulatory Visit: Payer: Self-pay

## 2019-07-28 DIAGNOSIS — F17210 Nicotine dependence, cigarettes, uncomplicated: Secondary | ICD-10-CM | POA: Insufficient documentation

## 2019-07-28 DIAGNOSIS — J45909 Unspecified asthma, uncomplicated: Secondary | ICD-10-CM | POA: Insufficient documentation

## 2019-07-28 DIAGNOSIS — L723 Sebaceous cyst: Secondary | ICD-10-CM | POA: Diagnosis not present

## 2019-07-28 DIAGNOSIS — L089 Local infection of the skin and subcutaneous tissue, unspecified: Secondary | ICD-10-CM | POA: Insufficient documentation

## 2019-07-28 DIAGNOSIS — L0211 Cutaneous abscess of neck: Secondary | ICD-10-CM | POA: Diagnosis present

## 2019-07-28 HISTORY — DX: Unspecified asthma, uncomplicated: J45.909

## 2019-07-28 MED ORDER — HYDROCODONE-ACETAMINOPHEN 5-325 MG PO TABS: 1.0000 | ORAL_TABLET | Freq: Four times a day (QID) | ORAL | 0 refills | Status: AC | PRN

## 2019-07-28 MED ORDER — SULFAMETHOXAZOLE-TRIMETHOPRIM 800-160 MG PO TABS: 1.0000 | ORAL_TABLET | Freq: Two times a day (BID) | ORAL | 0 refills | Status: AC

## 2019-07-28 MED ORDER — LIDOCAINE HCL (PF) 1 % IJ SOLN
5.0000 mL | Freq: Once | INTRAMUSCULAR | Status: AC
  Administered 2019-07-28: 14:00:00 5 mL
  Filled 2019-07-28: qty 5

## 2019-07-28 NOTE — ED Triage Notes (Signed)
Pt presents to ED via POV with c/o cyst to her neck. Pt states has been there for more than 1 year. Pt denies difficulty breathing at this time, pt states has come a head. Pt denies any changes in voice, difficulty swallowing, difficulty breathing.

## 2019-07-28 NOTE — ED Notes (Signed)
Pt c/o abscess on neck area that has been growing over time for about a year. Pt states it has grown larger over the past few days and has never been lanced before.

## 2019-07-28 NOTE — ED Provider Notes (Signed)
[facility] Emergency Department Provider Note   ____________________________________________   First MD Initiated Contact with Patient 07/28/19 1242     (approximate)  I have reviewed the triage vital signs and the nursing notes.   HISTORY  Chief Complaint Abscess   HPI Jackie Rowe is a 54 y.o. female presents to the ED with complaint of an abscess on her neck.  Patient states that there is been an area there for approximately 1 year.  She states is gotten a lot larger in the last few days and now erythematous.  She denies any fever, chills, nausea or vomiting.  Currently she denies any pain.       Past Medical History:  Diagnosis Date  . Asthma     There are no problems to display for this patient.   Past Surgical History:  Procedure Laterality Date  . ABDOMINAL HYSTERECTOMY      Prior to Admission medications   Medication Sig Start Date End Date Taking? Authorizing Provider  HYDROcodone-acetaminophen (NORCO/VICODIN) 5-325 MG tablet Take 1 tablet by mouth every 6 (six) hours as needed for moderate pain. 07/28/19   Tommi Rumps, PA-C  sulfamethoxazole-trimethoprim (BACTRIM DS) 800-160 MG tablet Take 1 tablet by mouth 2 (two) times daily. 07/28/19   Tommi Rumps, PA-C    Allergies Patient has no known allergies.  History reviewed. No pertinent family history.  Social History Social History   Tobacco Use  . Smoking status: Current Every Day Smoker    Packs/day: 0.50    Types: Cigarettes  . Smokeless tobacco: Never Used  Substance Use Topics  . Alcohol use: Not Currently    Comment: last use 5 yrs ago  . Drug use: Not Currently    Comment: last use 5 yrs ago    Review of Systems Constitutional: No fever/chills Eyes: No visual changes. ENT: No sore throat. Cardiovascular: Denies chest pain. Respiratory: Denies shortness of breath. Gastrointestinal:   No nausea, no vomiting.  Musculoskeletal: Negative for muscle  aches. Skin: Positive for abscess. Neurological: Negative for headaches, focal weakness or numbness. ____________________________________________   PHYSICAL EXAM:  VITAL SIGNS: ED Triage Vitals  Enc Vitals Group     BP 07/28/19 1232 137/88     Pulse Rate 07/28/19 1232 (!) 50     Resp 07/28/19 1232 20     Temp 07/28/19 1232 98.2 F (36.8 C)     Temp Source 07/28/19 1232 Oral     SpO2 07/28/19 1232 99 %     Weight 07/28/19 1233 150 lb (68 kg)     Height 07/28/19 1233 5\' 4"  (1.626 m)     Head Circumference --      Peak Flow --      Pain Score 07/28/19 1233 0     Pain Loc --      Pain Edu? --      Excl. in [place]? --    Constitutional: Alert and oriented. Well appearing and in no acute distress. Eyes: Conjunctivae are normal.  Head: Atraumatic. Neck: No stridor.   Cardiovascular: Normal rate, regular rhythm. Grossly normal heart sounds.  Good peripheral circulation. Respiratory: Normal respiratory effort.  No retractions. Lungs CTAB. Gastrointestinal: Soft and nontender. No distention. Musculoskeletal: Moves upper and lower extremities with any difficulty.  Normal gait was noted. Neurologic:  Normal speech and language. No gross focal neurologic deficits are appreciated.  Skin:  Skin is warm, dry.  Erythematous 2 cm sebaceous cyst anterior mid neck is present.  Area is moderately tender to touch.  There is a pustule present at the top of the infected sebaceous cyst. Psychiatric: Mood and affect are normal. Speech and behavior are normal.  ____________________________________________   LABS (all labs ordered are listed, but only abnormal results are displayed)  Labs Reviewed - No data to display  PROCEDURES  Procedure(s) performed (including Critical Care):  Marland KitchenMarland KitchenIncision and Drainage  Date/Time: 07/28/2019 1:47 PM Performed by: Johnn Hai, PA-C Authorized by: Johnn Hai, PA-C   Consent:    Consent obtained:  Verbal   Consent given by:  Patient   Risks  discussed:  Pain and infection Location:    Type:  Cyst   Location:  Neck   Neck location:  L anterior Pre-procedure details:    Skin preparation:  Antiseptic wash Anesthesia (see MAR for exact dosages):    Anesthesia method:  Local infiltration   Local anesthetic:  Lidocaine 1% w/o epi Procedure type:    Complexity:  Simple Procedure details:    Needle aspiration: no     Incision types:  Single straight   Incision depth:  Dermal   Scalpel blade:  11   Wound management:  Probed and deloculated   Drainage:  Purulent   Drainage amount:  Scant   Wound treatment:  Drain placed   Packing materials:  1/4 in iodoform gauze Post-procedure details:    Patient tolerance of procedure:  Tolerated well, no immediate complications   ____________________________________________   INITIAL IMPRESSION / ASSESSMENT AND PLAN / ED COURSE  As part of my medical decision making, I reviewed the following data within the electronic MEDICAL RECORD NUMBER Notes from prior ED visits and [place] Controlled Substance Database  54 year old female presents to the ED with complaint of abscess on her anterior mid neck.  Patient states that she has had a cyst in the same area for approximately 1 year.  She had her PCP look at it and was told to watch it.  It is only been the last several days but it became extremely red and tender to touch.  Area was I&D and without any problems.  Patient is aware that she needs to start on the antibiotic today and Bactrim and Norco was sent to her pharmacy.  She is to return to the emergency department in 2 days or to her PCP for removal of the iodoform drain.  ____________________________________________   FINAL CLINICAL IMPRESSION(S) / ED DIAGNOSES  Final diagnoses:  Infected sebaceous cyst of skin     ED Discharge Orders         Ordered    sulfamethoxazole-trimethoprim (BACTRIM DS) 800-160 MG tablet  2 times daily     07/28/19 1343    HYDROcodone-acetaminophen  (NORCO/VICODIN) 5-325 MG tablet  Every 6 hours PRN     07/28/19 1343           Note:  This document was prepared using Dragon voice recognition software and may include unintentional dictation errors.    Johnn Hai, PA-C 07/28/19 1406    Delman Kitten, MD 07/28/19 [phone]

## 2019-07-28 NOTE — Discharge Instructions (Signed)
Follow-up with your primary care provider or return to the emergency department in 2 days to have the drain removed if it has not already fallen out on it's own.  Begin taking antibiotics as directed twice a day for the next 10 days and Norco is a narcotic pain medication.  This medication should not be taken while you are driving or operating machinery.  You may also use warm compresses to the area to help with drainage.

## 2020-02-04 ENCOUNTER — Other Ambulatory Visit: Payer: Self-pay | Admitting: Family Medicine

## 2020-02-04 DIAGNOSIS — Z1231 Encounter for screening mammogram for malignant neoplasm of breast: Secondary | ICD-10-CM

## 2022-05-14 ENCOUNTER — Emergency Department
Admission: EM | Admit: 2022-05-14 | Discharge: 2022-05-14 | Disposition: A | Discharge status: Home / Self Care | Payer: Self-pay | Attending: Student in an Organized Health Care Education/Training Program | Admitting: Student in an Organized Health Care Education/Training Program

## 2022-05-14 ENCOUNTER — Other Ambulatory Visit: Payer: Self-pay

## 2022-05-14 DIAGNOSIS — F439 Reaction to severe stress, unspecified: Secondary | ICD-10-CM | POA: Diagnosis not present

## 2022-05-14 DIAGNOSIS — R4689 Other symptoms and signs involving appearance and behavior: Secondary | ICD-10-CM | POA: Diagnosis present

## 2022-05-14 LAB — COMPREHENSIVE METABOLIC PANEL
ALT: 20 U/L (ref 0–44)
AST: 31 U/L (ref 15–41)
Albumin: 3.7 g/dL (ref 3.5–5.0)
Alkaline Phosphatase: 46 U/L (ref 38–126)
Anion gap: 16 — ABNORMAL HIGH (ref 5–15)
BUN: 22 mg/dL — ABNORMAL HIGH (ref 6–20)
CO2: 19 mmol/L — ABNORMAL LOW (ref 22–32)
Calcium: 8.8 mg/dL — ABNORMAL LOW (ref 8.9–10.3)
Chloride: 102 mmol/L (ref 98–111)
Creatinine, Ser: 0.7 mg/dL (ref 0.44–1.00)
GFR, Estimated: >60 mL/min (ref >60)
Glucose, Bld: 49 mg/dL — ABNORMAL LOW (ref 70–99)
Potassium: 4.1 mmol/L (ref 3.5–5.1)
Sodium: 137 mmol/L (ref 135–145)
Total Bilirubin: 0.6 mg/dL (ref 0.3–1.2)
Total Protein: 6.8 g/dL (ref 6.5–8.1)

## 2022-05-14 LAB — URINE DRUG SCREEN, QUALITATIVE (ARMC ONLY)
Amphetamines, Ur Screen: NOT DETECTED
Barbiturates, Ur Screen: NOT DETECTED
Benzodiazepine, Ur Scrn: NOT DETECTED
Cannabinoid 50 Ng, Ur ~~LOC~~: NOT DETECTED
Cocaine Metabolite,Ur ~~LOC~~: POSITIVE — AB
MDMA (Ecstasy)Ur Screen: NOT DETECTED
Methadone Scn, Ur: NOT DETECTED
Opiate, Ur Screen: NOT DETECTED
Phencyclidine (PCP) Ur S: NOT DETECTED
Tricyclic, Ur Screen: NOT DETECTED

## 2022-05-14 LAB — CBC
HCT: 38 % (ref 36.0–46.0)
Hemoglobin: 12.3 g/dL (ref 12.0–15.0)
MCH: 28.4 pg (ref 26.0–34.0)
MCHC: 32.4 g/dL (ref 30.0–36.0)
MCV: 87.8 fL (ref 80.0–100.0)
Platelets: 183 10*3/uL (ref 150–400)
RBC: 4.33 MIL/uL (ref 3.87–5.11)
RDW: 13.6 % (ref 11.5–15.5)
WBC: 10.5 10*3/uL (ref 4.0–10.5)
nRBC: 0 % (ref 0.0–0.2)

## 2022-05-14 LAB — ETHANOL: Alcohol, Ethyl (B): 32 mg/dL — ABNORMAL HIGH (ref <10)

## 2022-05-14 LAB — ACETAMINOPHEN LEVEL: Acetaminophen (Tylenol), Serum: <10 ug/mL — ABNORMAL LOW (ref 10–30)

## 2022-05-14 LAB — POC URINE PREG, ED: Preg Test, Ur: NEGATIVE

## 2022-05-14 LAB — CBG MONITORING, ED: Glucose-Capillary: 89 mg/dL (ref 70–99)

## 2022-05-14 LAB — SALICYLATE LEVEL: Salicylate Lvl: <7 mg/dL — ABNORMAL LOW (ref 7.0–30.0)
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
